# Patient Record
Sex: Female | Born: 1940 | Race: Black or African American | Hispanic: No | Smoking: Never smoker
Health system: Southern US, Community
[De-identification: ages and names within clinical notes are randomized; demographics above are authoritative.]

---

## 2007-11-05 ENCOUNTER — Ambulatory Visit: Payer: Self-pay | Admitting: Unknown Physician Specialty

## 2011-10-08 DIAGNOSIS — M109 Gout, unspecified: Secondary | ICD-10-CM | POA: Insufficient documentation

## 2011-10-08 DIAGNOSIS — R002 Palpitations: Secondary | ICD-10-CM | POA: Insufficient documentation

## 2011-10-08 DIAGNOSIS — R42 Dizziness and giddiness: Secondary | ICD-10-CM | POA: Insufficient documentation

## 2011-10-08 DIAGNOSIS — E785 Hyperlipidemia, unspecified: Secondary | ICD-10-CM | POA: Insufficient documentation

## 2011-10-08 DIAGNOSIS — K219 Gastro-esophageal reflux disease without esophagitis: Secondary | ICD-10-CM | POA: Insufficient documentation

## 2011-10-08 DIAGNOSIS — I10 Essential (primary) hypertension: Secondary | ICD-10-CM | POA: Insufficient documentation

## 2011-10-08 DIAGNOSIS — F419 Anxiety disorder, unspecified: Secondary | ICD-10-CM | POA: Insufficient documentation

## 2011-10-09 DIAGNOSIS — I493 Ventricular premature depolarization: Secondary | ICD-10-CM | POA: Insufficient documentation

## 2012-08-21 ENCOUNTER — Emergency Department: Payer: Self-pay | Admitting: Emergency Medicine

## 2012-08-21 LAB — TROPONIN I: Troponin-I: <0.02 ng/mL

## 2012-08-21 LAB — URINALYSIS, COMPLETE
Bilirubin,UR: NEGATIVE
Blood: NEGATIVE
Glucose,UR: NEGATIVE mg/dL (ref 0–75)
Ketone: NEGATIVE
Leukocyte Esterase: NEGATIVE
Nitrite: NEGATIVE
Ph: 6 (ref 4.5–8.0)
Protein: NEGATIVE
RBC,UR: 1 /HPF (ref 0–5)
Specific Gravity: 1.01 (ref 1.003–1.030)

## 2012-08-21 LAB — CBC
HCT: 40.1 % (ref 35.0–47.0)
HGB: 13.7 g/dL (ref 12.0–16.0)
MCH: 32.8 pg (ref 26.0–34.0)
MCHC: 34.2 g/dL (ref 32.0–36.0)
Platelet: 185 10*3/uL (ref 150–440)
RDW: 12.9 % (ref 11.5–14.5)
WBC: 4.8 10*3/uL (ref 3.6–11.0)

## 2012-08-21 LAB — BASIC METABOLIC PANEL
Anion Gap: 5 — ABNORMAL LOW (ref 7–16)
Calcium, Total: 8.6 mg/dL (ref 8.5–10.1)
Chloride: 105 mmol/L (ref 98–107)
Co2: 29 mmol/L (ref 21–32)
EGFR (Non-African Amer.): 58 — ABNORMAL LOW
Glucose: 114 mg/dL — ABNORMAL HIGH (ref 65–99)
Osmolality: 278 (ref 275–301)
Potassium: 3.5 mmol/L (ref 3.5–5.1)
Sodium: 139 mmol/L (ref 136–145)

## 2017-03-24 DIAGNOSIS — I517 Cardiomegaly: Secondary | ICD-10-CM | POA: Insufficient documentation

## 2018-11-12 ENCOUNTER — Other Ambulatory Visit: Payer: Self-pay | Admitting: Endocrinology

## 2018-11-12 ENCOUNTER — Other Ambulatory Visit (HOSPITAL_COMMUNITY): Payer: Self-pay | Admitting: Endocrinology

## 2018-11-12 DIAGNOSIS — M25551 Pain in right hip: Secondary | ICD-10-CM

## 2018-11-12 DIAGNOSIS — M5137 Other intervertebral disc degeneration, lumbosacral region: Secondary | ICD-10-CM

## 2018-11-18 ENCOUNTER — Ambulatory Visit
Admission: RE | Admit: 2018-11-18 | Discharge: 2018-11-18 | Disposition: A | Discharge status: Home / Self Care | Payer: Medicare Other | Source: Ambulatory Visit | Attending: Endocrinology | Admitting: Endocrinology

## 2018-11-18 ENCOUNTER — Other Ambulatory Visit: Payer: Self-pay

## 2018-11-18 DIAGNOSIS — M25551 Pain in right hip: Secondary | ICD-10-CM

## 2018-11-18 DIAGNOSIS — M5137 Other intervertebral disc degeneration, lumbosacral region: Secondary | ICD-10-CM | POA: Diagnosis present

## 2019-11-05 IMAGING — CT CT LUMBAR SPINE WITHOUT CONTRAST
1 of 7 series · 6 of 14 positions shown, 8 images · non-contrast
Comparison: Pelvis CT today reported separately.

CLINICAL DATA: 78-year-old female with low back pain radiating to
the right hip and leg with numbness and tingling. No known injury.

EXAM:
CT LUMBAR SPINE WITHOUT CONTRAST
TECHNIQUE: Multidetector CT imaging of the lumbar spine was performed without
intravenous contrast administration. Multiplanar CT image
reconstructions were also generated.

[Series 4: l spine soft · axial · 0.34mm/px · z∈[-760,-566]mm · 6 of 137 slices shown, 8 images]
[im 20/137  soft-tissue]
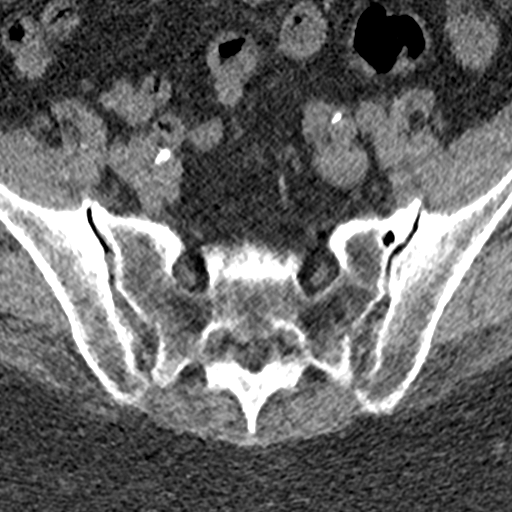
[im 20/137  bone]
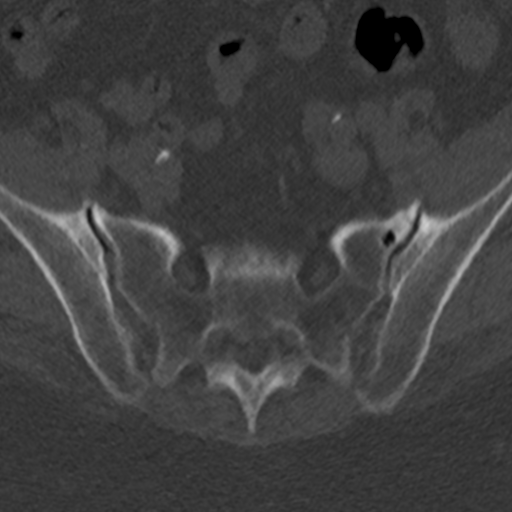
[im 39/137  bone]
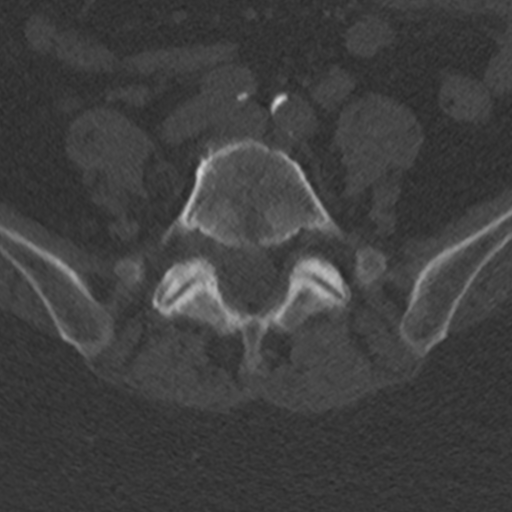
[im 59/137  bone]
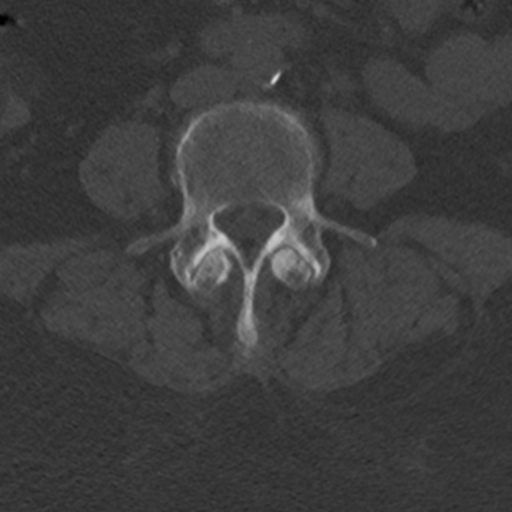
[im 78/137  bone]
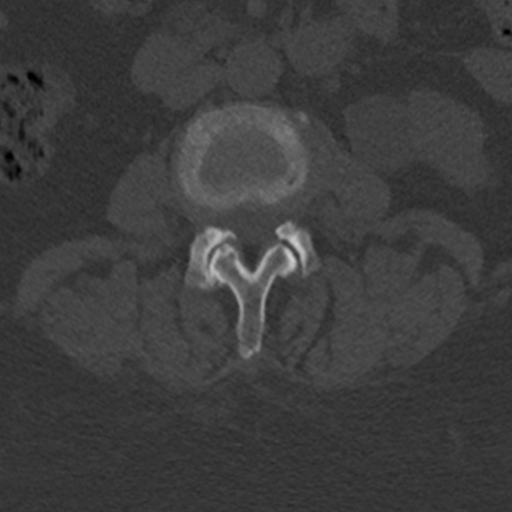
[im 98/137  soft-tissue]
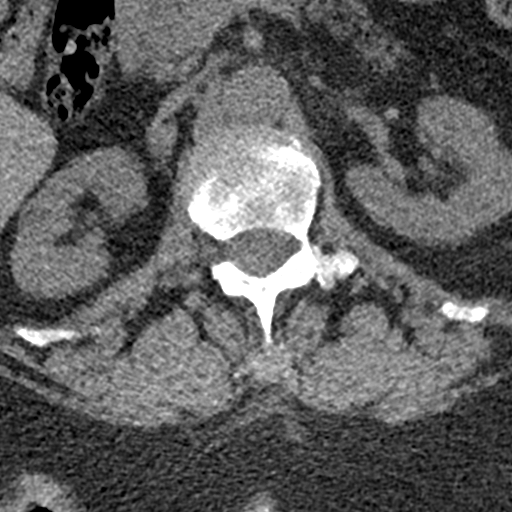
[im 98/137  bone]
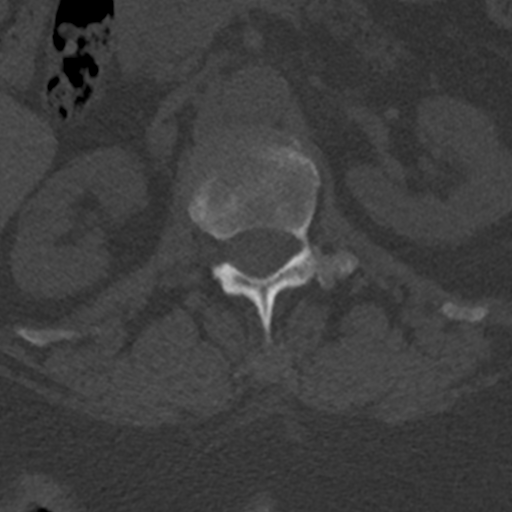
[im 117/137  bone]
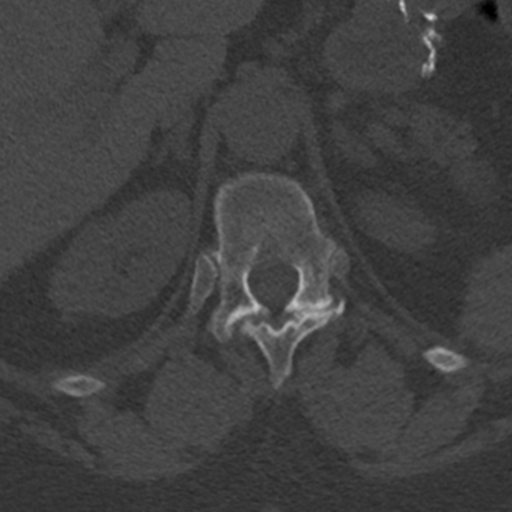

[6 of 14 positions shown; findings below may reference images not displayed]

FINDINGS: Segmentation: Normal.

Alignment: Slight dextroconvex lumbar curvature. Preserved lumbar
lordosis with mild grade 1 anterolisthesis of L4 on L5 (4
millimeters).

Vertebrae: No acute osseous abnormality identified. Visible sacrum
and SI joints are intact.

Paraspinal and other soft tissues: Partially visible prior gastric
bypass. Aortoiliac calcified atherosclerosis. Sigmoid colon
diverticulosis. Small simple fluid density left renal cyst near the
lower pole. Small round hyperdense right renal cyst at the midpole,
and probable similar size low-density cyst at the right lower pole.
Mild right upper pole renal cortical scarring. Negative visible
other noncontrast abdominal viscera.

Negative visualized posterior paraspinal soft tissues.

Disc levels: T10-T11: Mild posterior element hypertrophy.

T11-T12: Moderate facet hypertrophy. Mild right T11 foraminal
stenosis.

T12-L1:  Negative.

L1-L2:  Negative.

L2-L3: Mild circumferential disc bulge. Moderate facet hypertrophy
greater on the right. Borderline to mild spinal and right lateral
recess stenosis (right L3 nerve level).

L3-L4: Circumferential disc bulge. Moderate to severe posterior
element hypertrophy with vacuum facet. Mild to moderate spinal
stenosis and right greater than left L3 foraminal stenosis.

L4-L5: Grade 1 anterolisthesis. Circumferential disc bulge. Severe
facet hypertrophy and right greater than left vacuum facet. Moderate
ligament flavum hypertrophy. Moderate to severe spinal and lateral
recess stenosis. Moderate to severe left and mild to moderate right
L4 foraminal stenosis.

L5-S1: Disc space loss with extensive vacuum disc. Endplate
spurring. Mild to moderate facet hypertrophy. No spinal stenosis.
Mild right lateral recess stenosis suspected. Moderate to severe
bilateral L5 foraminal stenosis.
IMPRESSION: 1. No acute osseous abnormality in the lumbar spine.
2. Multifactorial spinal stenosis at L3-L4 (moderate) and L4-L5
(severe) in the setting of facet arthropathy. Grade 1
anterolisthesis at the latter.
3. Moderate to severe neural foraminal stenosis at the left L4 and
bilateral L5 nerve levels.
4. Multiple benign renal cysts suspected. Sigmoid diverticulosis.
Mild aortic atherosclerosis.

## 2021-09-12 ENCOUNTER — Ambulatory Visit
Admission: EM | Admit: 2021-09-12 | Discharge: 2021-09-12 | Disposition: A | Discharge status: Home / Self Care | Payer: Medicare PPO

## 2021-09-12 ENCOUNTER — Other Ambulatory Visit: Payer: Self-pay

## 2021-09-12 DIAGNOSIS — R102 Pelvic and perineal pain unspecified side: Secondary | ICD-10-CM | POA: Insufficient documentation

## 2021-09-12 DIAGNOSIS — M7550 Bursitis of unspecified shoulder: Secondary | ICD-10-CM | POA: Insufficient documentation

## 2021-09-12 DIAGNOSIS — Z0130 Encounter for examination of blood pressure without abnormal findings: Secondary | ICD-10-CM | POA: Diagnosis not present

## 2021-09-12 DIAGNOSIS — M719 Bursopathy, unspecified: Secondary | ICD-10-CM | POA: Insufficient documentation

## 2021-09-12 DIAGNOSIS — I10 Essential (primary) hypertension: Secondary | ICD-10-CM

## 2021-09-12 NOTE — ED Triage Notes (Signed)
Patient is here for "last week felt terrible, weak, fatigue, some improvement in evenings". Took BP at Pharmacy recently "194/83 or 183/94" (can't remember which). Went to PCP (Tuesday) with reading of "153/70" with elevated HR "recently". No cough. No sob. No runny nose. "Anxious about symptoms".  ?

## 2021-09-12 NOTE — ED Provider Notes (Signed)
?[facility] ? ? ? ?CSN: [id number] ?Arrival date & time: 09/12/21  1328 ? ? ?  ? ?History   ?Chief Complaint ?Chief Complaint  ?Patient presents with  ? Hypertension  ? ? ?HPI ?Brandi Busmanaola Rosenau is a 81 y.o. female presenting for a blood pressure check.  She is feeling well overall, and did not request to be seen by provider, but given clinic policy she is amenable to being seen by provider today.  Denies chest pain, shortness of breath, dizziness, weakness.  She does endorse more fatigue following her hydrochlorothiazide being doubled to 25 mg last week. ?Per triage note - ""194/83 or 183/94" (can't remember which). Went to PCP (Tuesday) with reading of "153/70" with elevated HR "recently". No cough. No sob. No runny nose." ? ?HPI ? ?History reviewed. No pertinent past medical history. ? ?Patient Active Problem List  ? Diagnosis Date Noted  ? Bursitis of shoulder 09/12/2021  ? Pelvic pressure in female 09/12/2021  ? Disorder of bursae of shoulder region 09/12/2021  ? LVH (left ventricular hypertrophy) 03/24/2017  ? PVC's (premature ventricular contractions) 10/09/2011  ? Anxiety 10/08/2011  ? GERD (gastroesophageal reflux disease) 10/08/2011  ? Gout 10/08/2011  ? HTN (hypertension) 10/08/2011  ? Hyperlipidemia 10/08/2011  ? Palpitations 10/08/2011  ? Vertigo 10/08/2011  ? ? ?History reviewed. No pertinent surgical history. ? ?OB History   ?No obstetric history on file. ?  ? ? ? ?Home Medications   ? ?Prior to Admission medications   ?Medication Sig Start Date End Date Taking? Authorizing Provider  ?ASPIRIN 81 PO Take 1 tablet by mouth daily. 12/28/08  Yes [provider]  ?Aspirin-Calcium Carbonate 81-777 MG TABS Take by mouth. 12/28/08  Yes [provider]  ?bisoprolol (ZEBETA) 10 MG tablet Take 10 mg by mouth. 1 and 1/2 daily. 06/24/16  Yes [provider]  ?ergocalciferol (VITAMIN D2) 1.25 MG (50000 UT) capsule Vitamin D2 1,250 mcg (50,000 unit) capsule   Yes [provider]  ?esomeprazole (NEXIUM) 20 MG capsule Take by mouth. 08/23/10  Yes [provider]  ?esomeprazole (NEXIUM) 40 MG capsule Nexium 40 mg capsule,delayed release   Yes [provider]  ?ezetimibe-simvastatin (VYTORIN) 10-40 MG tablet Take 1 tablet by mouth daily. 12/28/08  Yes [provider]  ?fluticasone (FLONASE) 50 MCG/ACT nasal spray Place into the nose. 06/19/17  Yes [provider]  ?hydrochlorothiazide (MICROZIDE) 12.5 MG capsule Take 25 mg by mouth daily.   Yes [provider]  ?ibuprofen (ADVIL) 200 MG tablet 1 -2 tabs by mouth every 6-8 hrs daily as needed 03/22/09  Yes [provider]  ?solifenacin (VESICARE) 5 MG tablet Take by mouth. 07/06/18  Yes [provider]  ?allopurinol (ZYLOPRIM) 300 MG tablet allopurinol 300 mg tablet    [provider]  ?ALPRAZolam Prudy Feeler(XANAX) 0.5 MG tablet alprazolam 0.5 mg tablet 12/28/08   [provider]  ?amoxicillin (AMOXIL) 500 MG capsule amoxicillin 500 mg capsule    [provider]  ?chlorhexidine (PERIDEX) 0.12 % solution chlorhexidine gluconate 0.12 % mouthwash    [provider]  ?Cholecalciferol 25 MCG (1000 UT) capsule Take by mouth.    [provider]  ?ciprofloxacin (CIPRO) 500 MG tablet ciprofloxacin 500 mg tablet    [provider]  ?citalopram (CELEXA) 20 MG tablet Take 20 mg by mouth daily. 09/11/21   [provider]  ?diflunisal (DOLOBID) 500 MG TABS tablet diflunisal 500 mg tablet    [provider]  ?meclizine (ANTIVERT) 25 MG  tablet meclizine 25 mg tablet    [provider]  ?meloxicam (MOBIC) 15 MG tablet meloxicam 15 mg tablet    [provider]  ?metaxalone (SKELAXIN) 800 MG tablet metaxalone 800 mg tablet    [provider]  ?PREDNISONE PO prednisone 10 mg tablets in a dose pack    [provider]  ? ? ?Family History ?No family history on file. ? ?Social History ?Social History   ? ?Tobacco Use  ? Smoking status: Never  ? Smokeless tobacco: Never  ?Vaping Use  ? Vaping Use: Never used  ?Substance Use Topics  ? Alcohol use: Not Currently  ? Drug use: Never  ? ? ? ?Allergies   ?Etodolac ? ? ?Review of Systems ?Review of Systems  ?All other systems reviewed and are negative. ? ? ?Physical Exam ?Triage Vital Signs ?ED Triage Vitals  ?Enc Vitals Group  ?   BP 09/12/21 1434 (!) 148/63  ?   Pulse Rate 09/12/21 1434 65  ?   Resp 09/12/21 1434 18  ?   Temp 09/12/21 1434 98.2 ?F (36.8 ?C)  ?   Temp Source 09/12/21 1434 Oral  ?   SpO2 09/12/21 1434 99 %  ?   Weight 09/12/21 1429 244 lb (110.7 kg)  ?   Height 09/12/21 1429 5\' 3"  (1.6 m)  ?   Head Circumference --   ?   Peak Flow --   ?   Pain Score 09/12/21 1429 0  ?   Pain Loc --   ?   Pain Edu? --   ?   Excl. in GC? --   ? ?No data found. ? ?Updated Vital Signs ?BP (!) 148/63 (BP Location: Left Arm)   Pulse 65   Temp 98.2 ?F (36.8 ?C) (Oral)   Resp 18   Ht 5\' 3"  (1.6 m)   Wt 244 lb (110.7 kg)   LMP  (LMP Unknown)   SpO2 99%   BMI 43.22 kg/m?  ? ?Visual Acuity ?Right Eye Distance:   ?Left Eye Distance:   ?Bilateral Distance:   ? ?Right Eye Near:   ?Left Eye Near:    ?Bilateral Near:    ? ?Physical Exam ?Vitals reviewed.  ?Constitutional:   ?   Appearance: Normal appearance. She is not diaphoretic.  ?HENT:  ?   Head: Normocephalic and atraumatic.  ?   Mouth/Throat:  ?   Mouth: Mucous membranes are moist.  ?Eyes:  ?   Extraocular Movements: Extraocular movements intact.  ?   Pupils: Pupils are equal, round, and reactive to light.  ?Cardiovascular:  ?   Rate and Rhythm: Normal rate and regular rhythm.  ?   Pulses:     ?     Radial pulses are 2+ on the right side and 2+ on the left side.  ?   Heart sounds: Normal heart sounds.  ?Pulmonary:  ?   Effort: Pulmonary effort is normal.  ?   Breath sounds: Normal breath sounds.  ?Abdominal:  ?   Palpations: Abdomen is soft.  ?   Tenderness: There is no abdominal tenderness. There is no guarding or  rebound.  ?Musculoskeletal:  ?   Right lower leg: 1+ Edema present.  ?   Left lower leg: 1+ Edema present.  ?Skin: ?   General: Skin is warm.  ?   Capillary Refill: Capillary refill takes less than 2 seconds.  ?Neurological:  ?   General: No focal deficit present.  ?   Mental  Status: She is alert and oriented to person, place, and time.  ?Psychiatric:     ?   Mood and Affect: Mood normal.     ?   Behavior: Behavior normal.     ?   Thought Content: Thought content normal.     ?   Judgment: Judgment normal.  ? ? ? ?UC Treatments / Results  ?Labs ?(all labs ordered are listed, but only abnormal results are displayed) ?Labs Reviewed - No data to display ? ?EKG ? ? ?Radiology ?No results found. ? ?Procedures ?Procedures (including critical care time) ? ?Medications Ordered in UC ?Medications - No data to display ? ?Initial Impression / Assessment and Plan / UC Course  ?I have reviewed the triage vital signs and the nursing notes. ? ?Pertinent labs & imaging results that were available during my care of the patient were reviewed by me and considered in my medical decision making (see chart for details). ? ?  ? ?This patient is a very pleasant 81 y.o. year old female presenting for BP check. Endorses fatigue with new increased dose of hctz (25mg ). Rec contacting PCP and decreasing back to 12.5mg  if they are amenable. BP is well controlled. ED return precautions discussed. Patient verbalizes understanding and agreement.  ? ? ?Final Clinical Impressions(s) / UC Diagnoses  ? ?Final diagnoses:  ?Essential hypertension  ?Blood pressure check  ? ? ? ?Discharge Instructions   ? ?  ?-Continue monitoring BP at home and record numbers ?-Follow-up if BP >150/90 and symptoms like chest pain, shortness of breath, dizziness, weakness.  ? ? ?ED Prescriptions   ?None ?  ? ?PDMP not reviewed this encounter. ?  ? , PA-C ?09/12/21 1511 ? ?

## 2021-09-12 NOTE — Discharge Instructions (Signed)
-  Continue monitoring BP at home and record numbers ?-Follow-up if BP >150/90 and symptoms like chest pain, shortness of breath, dizziness, weakness.  ?
